# Patient Record
Sex: Male | Born: 1995 | Race: White | Hispanic: No | Marital: Single | Smoking: Light tobacco smoker
Health system: Southern US, Community
[De-identification: ages and names within clinical notes are randomized; demographics above are authoritative.]

## PROBLEM LIST (undated history)

## (undated) DIAGNOSIS — J45909 Unspecified asthma, uncomplicated: Secondary | ICD-10-CM

## (undated) HISTORY — PX: HERNIA REPAIR: SHX51

---
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
## 2015-06-28 ENCOUNTER — Encounter: Payer: Self-pay | Admitting: Emergency Medicine

## 2015-06-28 ENCOUNTER — Emergency Department: Payer: BLUE CROSS/BLUE SHIELD

## 2015-06-28 DIAGNOSIS — S93401A Sprain of unspecified ligament of right ankle, initial encounter: Secondary | ICD-10-CM | POA: Diagnosis not present

## 2015-06-28 DIAGNOSIS — Y998 Other external cause status: Secondary | ICD-10-CM | POA: Diagnosis not present

## 2015-06-28 DIAGNOSIS — Y9351 Activity, roller skating (inline) and skateboarding: Secondary | ICD-10-CM | POA: Insufficient documentation

## 2015-06-28 DIAGNOSIS — Y9289 Other specified places as the place of occurrence of the external cause: Secondary | ICD-10-CM | POA: Insufficient documentation

## 2015-06-28 DIAGNOSIS — Z720 Tobacco use: Secondary | ICD-10-CM | POA: Diagnosis not present

## 2015-06-28 DIAGNOSIS — S99911A Unspecified injury of right ankle, initial encounter: Secondary | ICD-10-CM | POA: Diagnosis present

## 2015-06-28 NOTE — ED Notes (Signed)
Pt given ice pack for right swollen ankle

## 2015-06-28 NOTE — ED Notes (Signed)
Patient transported to X-ray 

## 2015-06-28 NOTE — ED Notes (Signed)
Pt states he was skateboarding earlier and fell off his board and rolled his ankle.  He is able to bear weight on it while ambulating.

## 2015-06-29 ENCOUNTER — Emergency Department
Admission: EM | Admit: 2015-06-29 | Discharge: 2015-06-29 | Disposition: A | Discharge status: Home / Self Care | Payer: BLUE CROSS/BLUE SHIELD | Attending: Emergency Medicine | Admitting: Emergency Medicine

## 2015-06-29 DIAGNOSIS — S93401A Sprain of unspecified ligament of right ankle, initial encounter: Secondary | ICD-10-CM

## 2015-06-29 HISTORY — DX: Unspecified asthma, uncomplicated: J45.909

## 2015-06-29 NOTE — ED Provider Notes (Signed)
[facility] Emergency Department Provider Note    ____________________________________________  Time seen: 0130  I have reviewed the triage vital signs and the nursing notes.   HISTORY  Chief Complaint Ankle Pain   History limited by: Not Limited   HPI DELONTE MUSICH is a 19 y.o. male who presents to the emergency department today with concerns for right ankle pain. Patient was trying to skateboarding trick when he had an inversion type injury to his right ankle. He was able to ambulate after the accident. The patient states that the pain has been somewhat constant since this happened. It is located more on the lateral aspect of his ankle. He has had some associated swelling. Patient denies any other injuries. Denies any head trauma.   Past Medical History  Diagnosis Date  . Asthma     There are no active problems to display for this patient.   Past Surgical History  Procedure Laterality Date  . Hernia repair      No current outpatient prescriptions on file.  Allergies Peanut butter flavor  History reviewed. No pertinent family history.  Social History Social History  Substance Use Topics  . Smoking status: Light Tobacco Smoker    Types: Cigarettes  . Smokeless tobacco: None  . Alcohol Use: 3.6 oz/week    6 Cans of beer per week    Review of Systems  Constitutional: Negative for fever. Cardiovascular: Negative for chest pain. Respiratory: Negative for shortness of breath. Gastrointestinal: Negative for abdominal pain, vomiting and diarrhea. Musculoskeletal: Negative for back pain. Skin: Negative for rash. Neurological: Negative for headaches, focal weakness or numbness.   10-point ROS otherwise negative.  ____________________________________________   PHYSICAL EXAM:  VITAL SIGNS: ED Triage Vitals  Enc Vitals Group     BP 06/28/15 2332 136/76 mmHg     Pulse Rate 06/28/15 2332 77     Resp 06/28/15 2332 16     Temp  06/28/15 2332 98 F (36.7 C)     Temp Source 06/28/15 2332 Oral     SpO2 06/28/15 2332 97 %     Weight 06/28/15 2332 143 lb (64.864 kg)     Height 06/28/15 2332  (1.702 m)     Head Cir --      Peak Flow --      Pain Score 06/28/15 2333 6   Constitutional: Alert and oriented. Well appearing and in no distress. Eyes: Conjunctivae are normal. PERRL. Normal extraocular movements. ENT   Head: Normocephalic and atraumatic.   Nose: No congestion/rhinnorhea.   Mouth/Throat: Mucous membranes are moist.   Neck: No stridor. Hematological/Lymphatic/Immunilogical: No cervical lymphadenopathy. Cardiovascular: Normal rate, regular rhythm.  No murmurs, rubs, or gallops. Respiratory: Normal respiratory effort without tachypnea nor retractions. Breath sounds are clear and equal bilaterally. No wheezes/rales/rhonchi. Gastrointestinal: Soft and nontender. No distention.  Genitourinary: Deferred Musculoskeletal: Right ankle with mild lateral swelling. No osseous tenderness. Neurovascularly intact distally. Skin intact. Neurologic:  Normal speech and language. No gross focal neurologic deficits are appreciated. Speech is normal.  Skin:  Skin is warm, dry and intact. No rash noted. Psychiatric: Mood and affect are normal. Speech and behavior are normal. Patient exhibits appropriate insight and judgment.  ____________________________________________    LABS (pertinent positives/negatives)  None  ____________________________________________   EKG  None  ____________________________________________    RADIOLOGY  Right ankle x-ray  IMPRESSION: Negative for acute fracture  I, Zedrick Springsteen, personally viewed and evaluated these images (plain radiographs) as part of my medical  decision making. _______________   PROCEDURES  Procedure(s) performed: None  Critical Care performed: No  ____________________________________________   INITIAL IMPRESSION / ASSESSMENT AND  PLAN / ED COURSE  Pertinent labs & imaging results that were available during my care of the patient were reviewed by me and considered in my medical decision making (see chart for details).  Patient presents to the emergency department today with concerns for right ankle pain. X-ray without any fractures. I do think patient suffered an ankle sprain. Will put in splint and discharge home. I did discuss rice with patient.  ____________________________________________   FINAL CLINICAL IMPRESSION(S) / ED DIAGNOSES  Final diagnoses:  Ankle sprain, right, initial encounter     Phineas Semen, MD 06/29/15 1610

## 2015-06-29 NOTE — Discharge Instructions (Signed)
Please seek medical attention for any high fevers, chest pain, shortness of breath, change in behavior, persistent vomiting, bloody stool or any other new or concerning symptoms.   Ankle Sprain An ankle sprain is an injury to the strong, fibrous tissues (ligaments) that hold the bones of your ankle joint together.  CAUSES An ankle sprain is usually caused by a fall or by twisting your ankle. Ankle sprains most commonly occur when you step on the outer edge of your foot, and your ankle turns inward. People who participate in sports are more prone to these types of injuries.  SYMPTOMS   Pain in your ankle. The pain may be present at rest or only when you are trying to stand or walk.  Swelling.  Bruising. Bruising may develop immediately or within 1 to 2 days after your injury.  Difficulty standing or walking, particularly when turning corners or changing directions. DIAGNOSIS  Your caregiver will ask you details about your injury and perform a physical exam of your ankle to determine if you have an ankle sprain. During the physical exam, your caregiver will press on and apply pressure to specific areas of your foot and ankle. Your caregiver will try to move your ankle in certain ways. An X-ray exam may be done to be sure a bone was not broken or a ligament did not separate from one of the bones in your ankle (avulsion fracture).  TREATMENT  Certain types of braces can help stabilize your ankle. Your caregiver can make a recommendation for this. Your caregiver may recommend the use of medicine for pain. If your sprain is severe, your caregiver may refer you to a surgeon who helps to restore function to parts of your skeletal system (orthopedist) or a physical therapist. HOME CARE INSTRUCTIONS   Apply ice to your injury for 1-2 days or as directed by your caregiver. Applying ice helps to reduce inflammation and pain.  Put ice in a plastic bag.  Place a towel between your skin and the  bag.  Leave the ice on for 15-20 minutes at a time, every 2 hours while you are awake.  Only take over-the-counter or prescription medicines for pain, discomfort, or fever as directed by your caregiver.  Elevate your injured ankle above the level of your heart as much as possible for 2-3 days.  If your caregiver recommends crutches, use them as instructed. Gradually put weight on the affected ankle. Continue to use crutches or a cane until you can walk without feeling pain in your ankle.  If you have a plaster splint, wear the splint as directed by your caregiver. Do not rest it on anything harder than a pillow for the first 24 hours. Do not put weight on it. Do not get it wet. You may take it off to take a shower or bath.  You may have been given an elastic bandage to wear around your ankle to provide support. If the elastic bandage is too tight (you have numbness or tingling in your foot or your foot becomes cold and blue), adjust the bandage to make it comfortable.  If you have an air splint, you may blow more air into it or let air out to make it more comfortable. You may take your splint off at night and before taking a shower or bath. Wiggle your toes in the splint several times per day to decrease swelling. SEEK MEDICAL CARE IF:   You have rapidly increasing bruising or swelling.  Your toes feel  extremely cold or you lose feeling in your foot. °· Your pain is not relieved with medicine. °SEEK IMMEDIATE MEDICAL CARE IF: °· Your toes are numb or blue. °· You have severe pain that is increasing. °MAKE SURE YOU:  °· Understand these instructions. °· Will watch your condition. °· Will get help right away if you are not doing well or get worse. °  °This information is not intended to replace advice given to you by your health care provider. Make sure you discuss any questions you have with your health care provider. °  °Document Released: 09/08/2005 Document Revised: 09/29/2014 Document Reviewed:  09/20/2011 °Elsevier Interactive Patient Education ©2016 [employer] ° °

## 2017-04-12 IMAGING — CR DG ANKLE COMPLETE 3+V*R*
1 series · 3 of 3 positions shown · non-contrast
Comparison: None.

CLINICAL DATA: Fell from skateboard

EXAM:
RIGHT ANKLE - COMPLETE 3+ VIEW

[Series 1: x ankle ap right · 0.14mm/px · 3 of 3 slices shown]
[im 1/3]
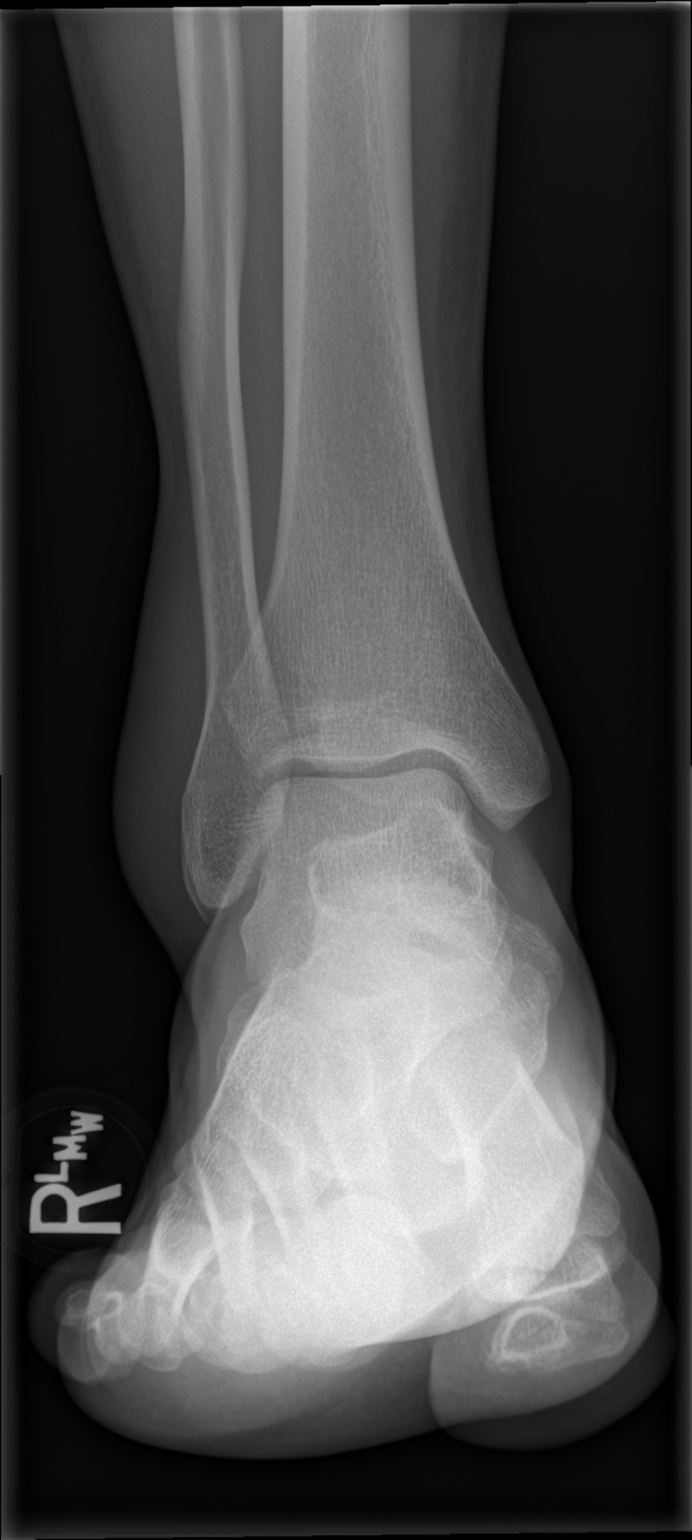
[im 2/3]
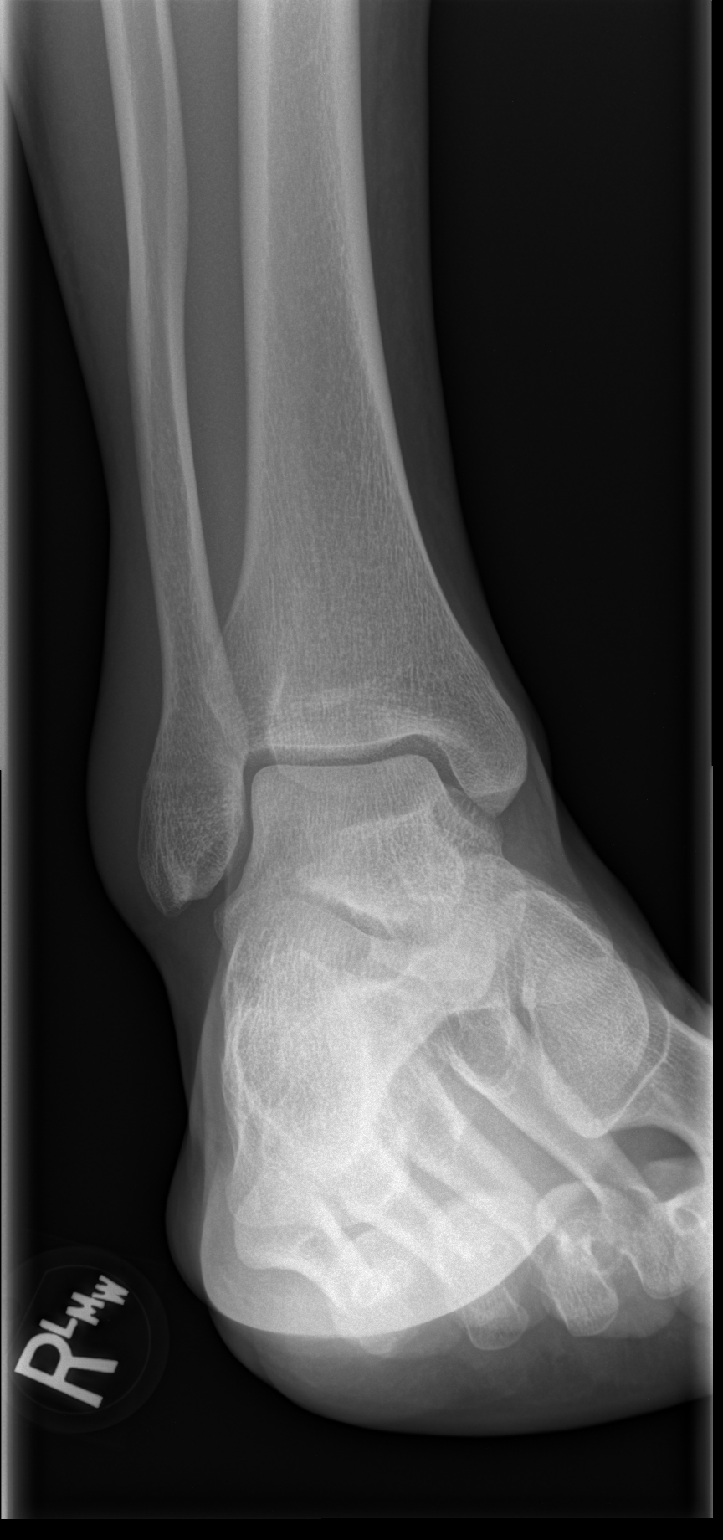
[im 3/3]
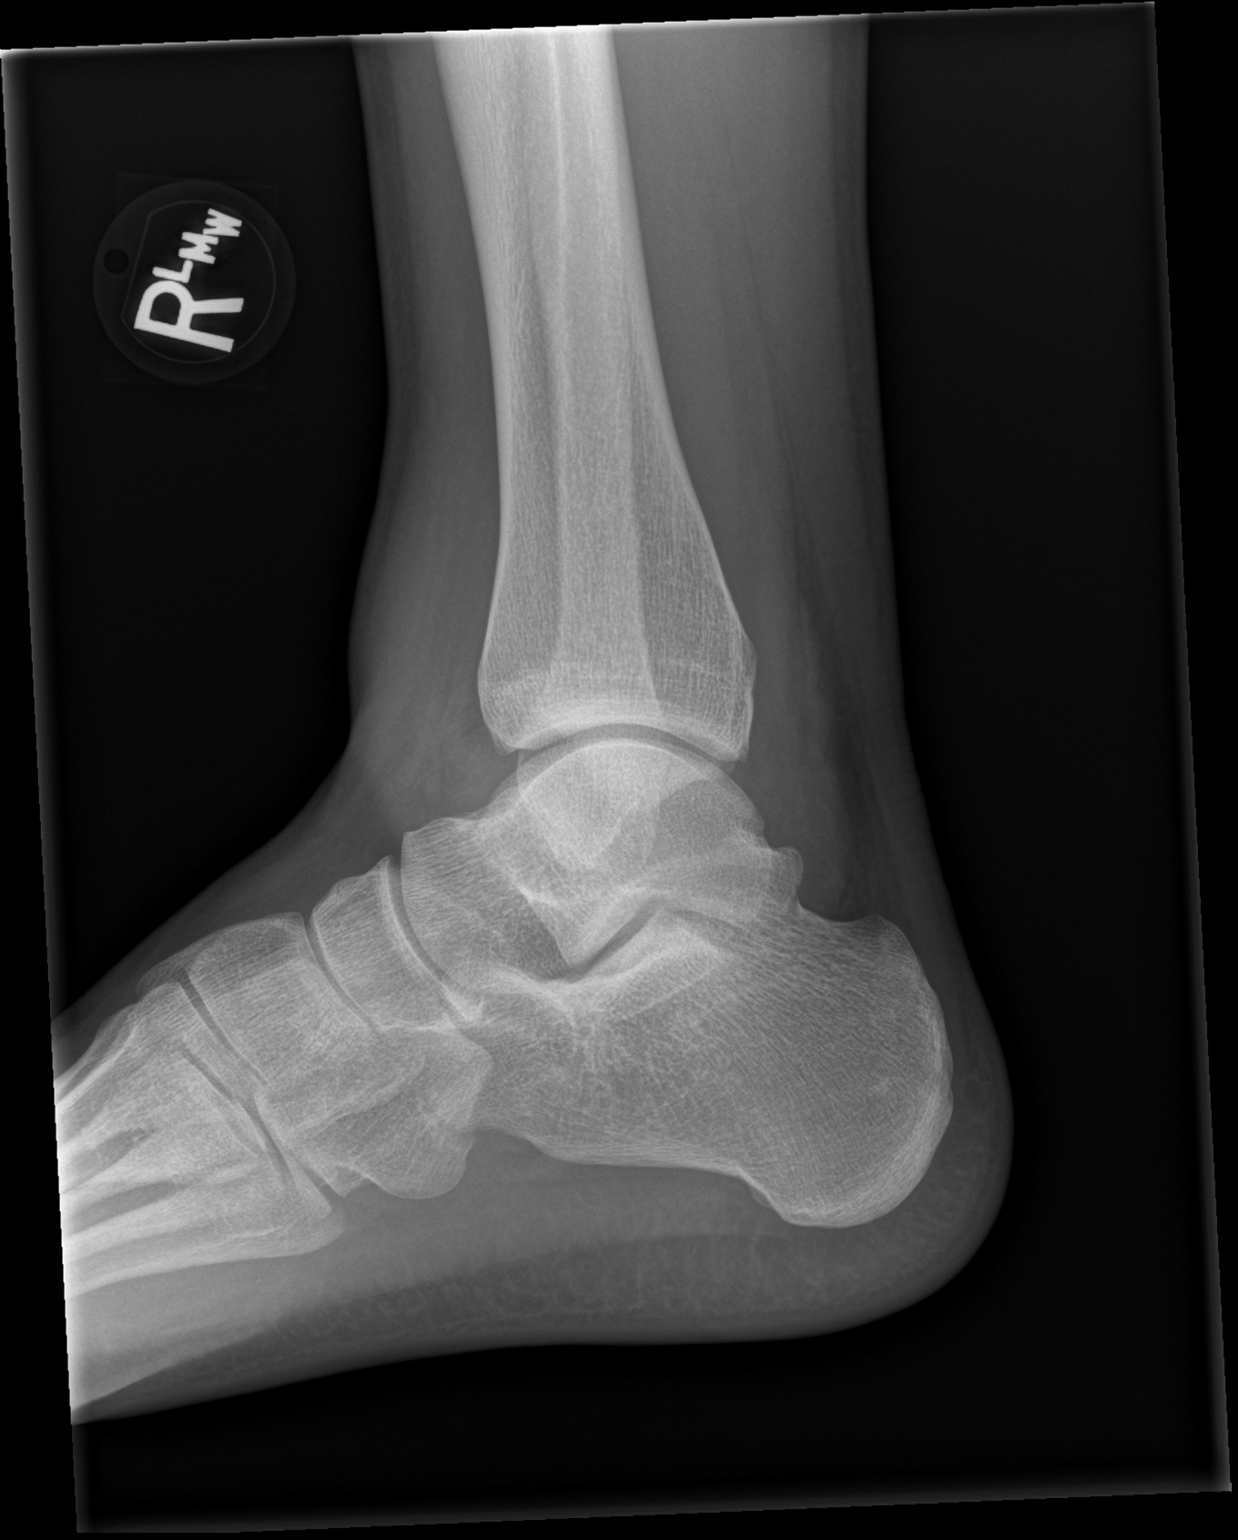

[3 of 3 positions shown; findings below may reference images not displayed]

FINDINGS: Negative for acute fracture or dislocation. There is lateral
malleolar soft tissue swelling. The mortise is symmetric.
IMPRESSION: Negative for acute fracture

## 2019-07-16 ENCOUNTER — Other Ambulatory Visit: Payer: Self-pay

## 2019-07-16 DIAGNOSIS — Z20822 Contact with and (suspected) exposure to covid-19: Secondary | ICD-10-CM

## 2019-07-17 LAB — NOVEL CORONAVIRUS, NAA: SARS-CoV-2, NAA: NOT DETECTED

## 2019-12-18 ENCOUNTER — Ambulatory Visit: Payer: Self-pay | Attending: Internal Medicine

## 2019-12-18 DIAGNOSIS — Z23 Encounter for immunization: Secondary | ICD-10-CM

## 2019-12-18 NOTE — Progress Notes (Signed)
   Covid-19 Vaccination Clinic  Name:  ENGLISH CRAIGHEAD    MRN: [id number] DOB: [birth year]  12/18/2019  Mr. Grove was observed post Covid-19 immunization for 30 minutes based on pre-vaccination screening without incident. He was provided with Vaccine Information Sheet and instruction to access the V-Safe system.   Mr. Casillas was instructed to call 911 with any severe reactions post vaccine: Marland Kitchen Difficulty breathing  . Swelling of face and throat  . A fast heartbeat  . A bad rash all over body  . Dizziness and weakness   Immunizations Administered    Name Date Dose VIS Date Route   Pfizer COVID-19 Vaccine 12/18/2019  1:15 PM 0.3 mL 09/02/2019 Intramuscular   Manufacturer: [employer], [employer]   Lot: [id number]   NDC: [address]-5

## 2020-01-10 ENCOUNTER — Ambulatory Visit: Payer: Self-pay | Attending: Internal Medicine

## 2020-01-10 DIAGNOSIS — Z23 Encounter for immunization: Secondary | ICD-10-CM

## 2020-01-10 NOTE — Progress Notes (Signed)
   Covid-19 Vaccination Clinic  Name:  BUNYAN BRIER    MRN: [id number] DOB: 1995/10/05  01/10/2020  Mr. Febo was observed post Covid-19 immunization for 30 minutes based on pre-vaccination screening without incident. He was provided with Vaccine Information Sheet and instruction to access the V-Safe system.   Mr. Demond was instructed to call 911 with any severe reactions post vaccine: Marland Kitchen Difficulty breathing  . Swelling of face and throat  . A fast heartbeat  . A bad rash all over body  . Dizziness and weakness   Immunizations Administered    Name Date Dose VIS Date Route   Pfizer COVID-19 Vaccine 01/10/2020  2:14 PM 0.3 mL 11/16/2018 Intramuscular   Manufacturer: [employer], [employer]   Lot: [id number]   NDC: [address]-0

## 2020-07-26 ENCOUNTER — Ambulatory Visit: Payer: Self-pay | Admitting: Physician Assistant

## 2020-07-26 ENCOUNTER — Other Ambulatory Visit: Payer: Self-pay

## 2020-07-26 DIAGNOSIS — J45909 Unspecified asthma, uncomplicated: Secondary | ICD-10-CM

## 2020-07-26 DIAGNOSIS — Z113 Encounter for screening for infections with a predominantly sexual mode of transmission: Secondary | ICD-10-CM

## 2020-07-26 DIAGNOSIS — Z202 Contact with and (suspected) exposure to infections with a predominantly sexual mode of transmission: Secondary | ICD-10-CM

## 2020-07-26 LAB — GRAM STAIN

## 2020-07-26 MED ORDER — CEFTRIAXONE SODIUM 500 MG IJ SOLR
500.0000 mg | Freq: Once | INTRAMUSCULAR | Status: AC
  Administered 2020-07-26: 500 mg via INTRAMUSCULAR

## 2020-07-26 MED ORDER — DOXYCYCLINE HYCLATE 100 MG PO TABS: 100.0000 mg | ORAL_TABLET | Freq: Two times a day (BID) | ORAL | 0 refills | Status: AC

## 2020-07-27 ENCOUNTER — Encounter: Payer: Self-pay | Admitting: Physician Assistant

## 2020-07-27 DIAGNOSIS — J45909 Unspecified asthma, uncomplicated: Secondary | ICD-10-CM | POA: Insufficient documentation

## 2020-07-27 NOTE — Progress Notes (Signed)
[facility] Department STI clinic/screening visit  Subjective:  [facility] is a 24 y.o. male being seen today for an STI screening visit. The patient reports they do not have symptoms.    Patient has the following medical conditions:  There are no problems to display for this patient.    Chief Complaint  Patient presents with  . SEXUALLY TRANSMITTED DISEASE    screening    HPI  Patient reports that he is not having any symptoms but was told by his partner that he should get screened.  Patient reports that his partner has "an infection in her ovary" and is being treated for GC and Chlamydia.  Reports history of asthma and uses an inhaler as needed.  Reports that he has not had a HIV test previously.  Last void prior to sample collection for Gram stain was about 2 hr ago.   See flowsheet for further details and programmatic requirements.    The following portions of the patient's history were reviewed and updated as appropriate: allergies, current medications, past medical history, past social history, past surgical history and problem list.  Objective:  There were no vitals filed for this visit.  Physical Exam Constitutional:      General: He is not in acute distress.    Appearance: Normal appearance.  HENT:     Head: Normocephalic and atraumatic.     Comments: No nits,lice, or hair loss. No cervical, supraclavicular or axillary adenopathy.    Mouth/Throat:     Mouth: Mucous membranes are moist.     Pharynx: Oropharynx is clear. No oropharyngeal exudate or posterior oropharyngeal erythema.  Eyes:     Conjunctiva/sclera: Conjunctivae normal.  Pulmonary:     Effort: Pulmonary effort is normal.  Abdominal:     Palpations: Abdomen is soft. There is no mass.     Tenderness: There is no abdominal tenderness. There is no guarding or rebound.  Genitourinary:    Penis: Normal.      Testes: Normal.     Comments: Pubic area without nits, lice, hair loss,  edema, erythema, lesions and inguinal adenopathy. Penis circumcised without rash, lesions and discharge at meatus. Musculoskeletal:     Cervical back: Neck supple. No tenderness.  Skin:    General: Skin is warm and dry.     Findings: No bruising, erythema, lesion or rash.  Neurological:     Mental Status: He is alert and oriented to person, place, and time.  Psychiatric:        Mood and Affect: Mood normal.        Behavior: Behavior normal.        Thought Content: Thought content normal.        Judgment: Judgment normal.       Assessment and Plan:  [name] is a 24 y.o. male presenting to the [facility] Department for STI screening  1. Screening for STD (sexually transmitted disease) Patient into clinic without symptoms. Rec condoms with all sex. Await test results.  Counseled that RN will call if needs to RTC for treatment once results are back. - Gram stain - Gonococcus culture - HBV Antigen/[facility]ibody [facility] - HIV/HCV [place] [facility] - Syphilis Serology, [facility] [facility] - Gonococcus culture  2. Venereal disease contact Will treat to cover patient for GC and Chlamydia since partner is also being treated. Given Ceftriaxone 500 mg IM and Doxycycline 100 mg #14 1 po BID for 7 days. No sex for  14 days and until after partner completes treatment. Patient observed after injection for 15 minutes and released without incident. Call with any questions or concerns. - cefTRIAXone (ROCEPHIN) injection 500 mg - doxycycline (VIBRA-TABS) 100 MG tablet; Take 1 tablet (100 mg total) by mouth 2 (two) times daily for 7 days.  Dispense: 14 tablet; Refill: 0     No follow-ups on file.  No future appointments.  Matt Holmes, PA

## 2020-07-29 NOTE — Progress Notes (Signed)
Chart reviewed by Pharmacist  Suzanne Walker PharmD, Contract Pharmacist at [facility]  

## 2020-07-31 LAB — GONOCOCCUS CULTURE

## 2022-08-04 ENCOUNTER — Emergency Department
Admission: EM | Admit: 2022-08-04 | Discharge: 2022-08-04 | Disposition: A | Discharge status: Home / Self Care | Payer: Managed Care, Other (non HMO) | Attending: Emergency Medicine | Admitting: Emergency Medicine

## 2022-08-04 ENCOUNTER — Encounter: Payer: Self-pay | Admitting: Emergency Medicine

## 2022-08-04 ENCOUNTER — Other Ambulatory Visit: Payer: Self-pay

## 2022-08-04 ENCOUNTER — Emergency Department: Payer: Managed Care, Other (non HMO)

## 2022-08-04 DIAGNOSIS — S61211A Laceration without foreign body of left index finger without damage to nail, initial encounter: Secondary | ICD-10-CM | POA: Diagnosis not present

## 2022-08-04 DIAGNOSIS — S61412A Laceration without foreign body of left hand, initial encounter: Secondary | ICD-10-CM

## 2022-08-04 DIAGNOSIS — S6992XA Unspecified injury of left wrist, hand and finger(s), initial encounter: Secondary | ICD-10-CM | POA: Diagnosis present

## 2022-08-04 DIAGNOSIS — S61012A Laceration without foreign body of left thumb without damage to nail, initial encounter: Secondary | ICD-10-CM | POA: Diagnosis not present

## 2022-08-04 DIAGNOSIS — W275XXA Contact with paper-cutter, initial encounter: Secondary | ICD-10-CM | POA: Insufficient documentation

## 2022-08-04 LAB — CBC WITH DIFFERENTIAL/PLATELET
Abs Immature Granulocytes: 0.02 10*3/uL (ref 0.00–0.07)
Basophils Absolute: 0 10*3/uL (ref 0.0–0.1)
Basophils Relative: 0 %
Eosinophils Absolute: 0.1 10*3/uL (ref 0.0–0.5)
Eosinophils Relative: 1 %
HCT: 40.2 % (ref 39.0–52.0)
Hemoglobin: 14.3 g/dL (ref 13.0–17.0)
Immature Granulocytes: 0 %
Lymphocytes Relative: 28 %
Lymphs Abs: 2.3 10*3/uL (ref 0.7–4.0)
MCH: 30.2 pg (ref 26.0–34.0)
MCHC: 35.6 g/dL (ref 30.0–36.0)
MCV: 85 fL (ref 80.0–100.0)
Monocytes Absolute: 0.7 10*3/uL (ref 0.1–1.0)
Monocytes Relative: 8 %
Neutro Abs: 5.2 10*3/uL (ref 1.7–7.7)
Neutrophils Relative %: 63 %
Platelets: 217 10*3/uL (ref 150–400)
RBC: 4.73 MIL/uL (ref 4.22–5.81)
RDW: 11.3 % — ABNORMAL LOW (ref 11.5–15.5)
WBC: 8.3 10*3/uL (ref 4.0–10.5)
nRBC: 0 % (ref 0.0–0.2)

## 2022-08-04 LAB — BASIC METABOLIC PANEL
Anion gap: 9 (ref 5–15)
BUN: 10 mg/dL (ref 6–20)
CO2: 22 mmol/L (ref 22–32)
Calcium: 9.6 mg/dL (ref 8.9–10.3)
Chloride: 107 mmol/L (ref 98–111)
Creatinine, Ser: 1.01 mg/dL (ref 0.61–1.24)
GFR, Estimated: >60 mL/min (ref >60)
Glucose, Bld: 119 mg/dL — ABNORMAL HIGH (ref 70–99)
Potassium: 3.5 mmol/L (ref 3.5–5.1)
Sodium: 138 mmol/L (ref 135–145)

## 2022-08-04 MED ORDER — LIDOCAINE HCL (PF) 1 % IJ SOLN
20.0000 mL | Freq: Once | INTRAMUSCULAR | Status: AC
  Administered 2022-08-04: 20 mL

## 2022-08-04 MED ORDER — CEPHALEXIN 500 MG PO CAPS: 500.0000 mg | ORAL_CAPSULE | Freq: Four times a day (QID) | ORAL | 0 refills | Status: AC

## 2022-08-04 NOTE — ED Provider Triage Note (Signed)
Emergency Medicine Provider Triage Evaluation Note  [facility] , a 26 y.o. male  was evaluated in triage.  Pt complains of laceration with box cutter to the left hand in the webspace of the thumb and index finger.  Was seen in urgent care and told it was arterial.  Try to remove the bandage but the area started to bleed continuously and vigorously.  Applied another pressure dressing here in triage.  Review of Systems  Positive:  Negative:   Physical Exam  There were no vitals taken for this visit. Gen:   Awake, no distress   Resp:  Normal effort  MSK:   Moves extremities without difficulty  Other:    Medical Decision Making  Medically screening exam initiated at 1:50 PM.  Appropriate orders placed.  [facility] was informed that the remainder of the evaluation will be completed by another provider, this initial triage assessment does not replace that evaluation, and the importance of remaining in the ED until their evaluation is complete.  See above   Faythe Ghee, PA-C 08/04/22 1351

## 2022-08-04 NOTE — ED Provider Notes (Signed)
[facility] Provider Note    Event Date/Time   First MD Initiated Contact with Patient 08/04/22 1355     (approximate)   History   Chief Complaint Laceration   HPI  [facility] is a 26 y.o. male with no significant past medical history who presents to the ED complaining of laceration.  Patient reports that about 2 hours prior to arrival he was attempting to break down cardboard with a box cutter when he slipped and the box cutter went into the space between his thumb and left index finger.  He states that he had immediate onset of significant bleeding, was initially able to control it with pressure and went to see his PCP.  He had significant ongoing bleeding after arriving there, where they updated his tetanus and attempted to close the wound but had difficulty controlling bleeding.  He was subsequently referred to the ED for further evaluation.     Physical Exam   Triage Vital Signs: ED Triage Vitals  Enc Vitals Group     BP 08/04/22 1354 (!) 134/90     Pulse Rate 08/04/22 1354 97     Resp 08/04/22 1354 18     Temp 08/04/22 1354 98.4 F (36.9 C)     Temp src --      SpO2 08/04/22 1354 97 %     Weight 08/04/22 1355 143 lb 4.8 oz (65 kg)     Height 08/04/22 1355 5\' 7"  (1.702 m)     Head Circumference --      Peak Flow --      Pain Score 08/04/22 1354 8     Pain Loc --      Pain Edu? --      Excl. in GC? --     Most recent vital signs: Vitals:   08/04/22 1354  BP: (!) 134/90  Pulse: 97  Resp: 18  Temp: 98.4 F (36.9 C)  SpO2: 97%    Constitutional: Alert and oriented. Eyes: Conjunctivae are normal. Head: Atraumatic. Nose: No congestion/rhinnorhea. Mouth/Throat: Mucous membranes are moist.  Cardiovascular: Normal rate, regular rhythm. Grossly normal heart sounds.  2+ radial pulses bilaterally. Respiratory: Normal respiratory effort.  No retractions. Lungs CTAB. Gastrointestinal: Soft and nontender. No  distention. Musculoskeletal: Approximately 6 cm laceration to the interspace between left thumb and index finger, brisk bleeding noted.  Range of motion intact to the digits of left hand.  Cap refill less than 2 seconds to digits of left hand.  No lower extremity tenderness nor edema.  Neurologic:  Normal speech and language. No gross focal neurologic deficits are appreciated.    ED Results / Procedures / Treatments   Labs (all labs ordered are listed, but only abnormal results are displayed) Labs Reviewed  CBC WITH DIFFERENTIAL/PLATELET - Abnormal; Notable for the following components:      Result Value   RDW 11.3 (*)    All other components within normal limits  BASIC METABOLIC PANEL - Abnormal; Notable for the following components:   Glucose, Bld 119 (*)    All other components within normal limits    RADIOLOGY Left hand x-ray reviewed and interpreted by me with no fracture, dislocation, or foreign body noted.  PROCEDURES:  Critical Care performed: No  ..Laceration Repair  Date/Time: 08/04/2022 3:48 PM  Performed by: 08/06/2022, MD Authorized by: Chesley Noon, MD   Consent:    Consent obtained:  Verbal   Consent given by:  Patient Universal protocol:  Patient identity confirmed:  Verbally with patient and arm band Anesthesia:    Anesthesia method:  Local infiltration   Local anesthetic:  Lidocaine 2% WITH epi Laceration details:    Location:  Hand   Hand location:  L hand, dorsum   Length (cm):  6 Pre-procedure details:    Preparation:  Patient was prepped and draped in usual sterile fashion and imaging obtained to evaluate for foreign bodies Exploration:    Limited defect created (wound extended): no     Hemostasis achieved with:  Direct pressure and epinephrine   Imaging obtained: x-ray     Imaging outcome: foreign body not noted     Wound exploration: wound explored through full range of motion and entire depth of wound visualized     Wound extent:  areolar tissue not violated, fascia not violated, no foreign body, no signs of injury, no nerve damage, no tendon damage and no underlying fracture     Contaminated: no   Treatment:    Area cleansed with:  Saline   Amount of cleaning:  Standard   Irrigation solution:  Sterile saline   Irrigation method:  Pressure wash   Visualized foreign bodies/material removed: no     Debridement:  None   Undermining:  None   Scar revision: no   Skin repair:    Repair method:  Sutures   Suture size:  4-0   Suture material:  Nylon   Suture technique:  Simple interrupted   Number of sutures:  6 Approximation:    Approximation:  Close Repair type:    Repair type:  Simple Post-procedure details:    Dressing:  Non-adherent dressing   Procedure completion:  Tolerated well, no immediate complications    MEDICATIONS ORDERED IN ED: Medications  lidocaine (PF) (XYLOCAINE) 1 % injection 20 mL (20 mLs Infiltration Given by Other 08/04/22 1407)     IMPRESSION / MDM / ASSESSMENT AND PLAN / ED COURSE  I reviewed the triage vital signs and the nursing notes.                              26 y.o. male with no significant past medical history presents to the ED with laceration in the interspace between his left thumb and index finger after slipping with a box cutter.  Patient's presentation is most consistent with acute complicated illness / injury requiring diagnostic workup.  Differential diagnosis includes, but is not limited to, laceration, vascular injury, tendon injury, nerve injury, foreign body.  Patient brought immediately back to her room shortly after arrival to the ED due to brisk bleeding noted ongoing from laceration to his left hand.  With removal of bulky dressing he did have ongoing brisk bleeding, which was controlled with pressure.  Patient anesthetized with lidocaine with epinephrine, 5 sutures initially placed however patient had ongoing bleeding afterwards.  An additional 6 suture was  placed with good hemostasis following about 10 minutes of pressure.  He has good cap refill of less than 2 seconds in all digits of left hand and I doubt significant vascular injury.  He retains range of motion in the fingers of his left hand and I doubt significant ligamentous injury.  Patient's tetanus was updated prior to arrival in the ED.  Labs obtained and reassuring with no significant anemia, leukocytosis, electrolyte abnormality, or AKI.  Patient turned over to monitor provider pending observation and reassessment to ensure good hemostasis.  If he  remains hemostatic, will place in dressing with splint and have patient follow-up in 1 week for suture removal.  We will start him on antibiotics due to risk for infection.      FINAL CLINICAL IMPRESSION(S) / ED DIAGNOSES   Final diagnoses:  Laceration of left hand without foreign body, initial encounter     Rx / DC Orders   ED Discharge Orders          Ordered    cephALEXin (KEFLEX) 500 MG capsule  4 times daily        08/04/22 1549             Note:  This document was prepared using Dragon voice recognition software and may include unintentional dictation errors.   Chesley Noon, MD 08/04/22 [phone]

## 2022-08-04 NOTE — ED Triage Notes (Signed)
Pt was using a box cutter and cut it hand in between the left send and first finger. Pt has a pressure dressing on his hand at this time that his PCP applied. Blood is coming through pressure dressing. Pt taken to room for further treatment.

## 2024-02-21 DEATH — deceased
